# Patient Record
Sex: Male | Born: 1985 | Race: White | Hispanic: No | Marital: Single | Smoking: Current every day smoker
Health system: Southern US, Community
[De-identification: ages and names within clinical notes are randomized; demographics above are authoritative.]

## PROBLEM LIST (undated history)

## (undated) DIAGNOSIS — R011 Cardiac murmur, unspecified: Secondary | ICD-10-CM

## (undated) DIAGNOSIS — J45909 Unspecified asthma, uncomplicated: Secondary | ICD-10-CM

---

## 2013-08-17 ENCOUNTER — Encounter (HOSPITAL_COMMUNITY): Payer: Self-pay | Admitting: Emergency Medicine

## 2013-08-17 ENCOUNTER — Inpatient Hospital Stay (HOSPITAL_COMMUNITY)
Admission: EM | Admit: 2013-08-17 | Discharge: 2013-08-19 | DRG: 903 | Disposition: A | Discharge status: Home / Self Care | Payer: 59 | Attending: Specialist | Admitting: Specialist

## 2013-08-17 ENCOUNTER — Emergency Department (HOSPITAL_COMMUNITY): Payer: 59

## 2013-08-17 DIAGNOSIS — S81819A Laceration without foreign body, unspecified lower leg, initial encounter: Secondary | ICD-10-CM | POA: Diagnosis present

## 2013-08-17 DIAGNOSIS — S81809A Unspecified open wound, unspecified lower leg, initial encounter: Principal | ICD-10-CM

## 2013-08-17 DIAGNOSIS — S20419A Abrasion of unspecified back wall of thorax, initial encounter: Secondary | ICD-10-CM

## 2013-08-17 DIAGNOSIS — S20319A Abrasion of unspecified front wall of thorax, initial encounter: Secondary | ICD-10-CM

## 2013-08-17 DIAGNOSIS — S81812A Laceration without foreign body, left lower leg, initial encounter: Secondary | ICD-10-CM | POA: Diagnosis present

## 2013-08-17 DIAGNOSIS — IMO0002 Reserved for concepts with insufficient information to code with codable children: Secondary | ICD-10-CM | POA: Diagnosis present

## 2013-08-17 DIAGNOSIS — Y92009 Unspecified place in unspecified non-institutional (private) residence as the place of occurrence of the external cause: Secondary | ICD-10-CM

## 2013-08-17 DIAGNOSIS — F17200 Nicotine dependence, unspecified, uncomplicated: Secondary | ICD-10-CM | POA: Diagnosis present

## 2013-08-17 DIAGNOSIS — S81009A Unspecified open wound, unspecified knee, initial encounter: Principal | ICD-10-CM | POA: Diagnosis present

## 2013-08-17 DIAGNOSIS — S30811A Abrasion of abdominal wall, initial encounter: Secondary | ICD-10-CM

## 2013-08-17 DIAGNOSIS — J45909 Unspecified asthma, uncomplicated: Secondary | ICD-10-CM | POA: Diagnosis present

## 2013-08-17 DIAGNOSIS — S91009A Unspecified open wound, unspecified ankle, initial encounter: Principal | ICD-10-CM

## 2013-08-17 HISTORY — DX: Cardiac murmur, unspecified: R01.1

## 2013-08-17 HISTORY — DX: Unspecified asthma, uncomplicated: J45.909

## 2013-08-17 LAB — I-STAT CHEM 8, ED
BUN: 20 mg/dL (ref 6–23)
CALCIUM ION: 1.08 mmol/L — AB (ref 1.12–1.23)
Chloride: 104 mEq/L (ref 96–112)
Creatinine, Ser: 1.2 mg/dL (ref 0.50–1.35)
Glucose, Bld: 119 mg/dL — ABNORMAL HIGH (ref 70–99)
HCT: 50 % (ref 39.0–52.0)
HEMOGLOBIN: 17 g/dL (ref 13.0–17.0)
POTASSIUM: 5.1 meq/L (ref 3.7–5.3)
Sodium: 138 mEq/L (ref 137–147)
TCO2: 23 mmol/L (ref 0–100)

## 2013-08-17 MED ORDER — FENTANYL CITRATE 0.05 MG/ML IJ SOLN
50.0000 ug | Freq: Once | INTRAMUSCULAR | Status: AC
  Administered 2013-08-17: 50 ug via INTRAVENOUS
  Filled 2013-08-17: qty 2

## 2013-08-17 MED ORDER — ONDANSETRON HCL 4 MG/2ML IJ SOLN
4.0000 mg | Freq: Once | INTRAMUSCULAR | Status: AC
  Administered 2013-08-17: 4 mg via INTRAVENOUS
  Filled 2013-08-17: qty 2

## 2013-08-17 MED ORDER — SODIUM CHLORIDE 0.9 % IV SOLN
Freq: Once | INTRAVENOUS | Status: AC
  Administered 2013-08-17: 23:00:00 via INTRAVENOUS

## 2013-08-17 NOTE — ED Notes (Signed)
Xray at [facility] 
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
## 2013-08-18 ENCOUNTER — Emergency Department (HOSPITAL_COMMUNITY): Payer: 59 | Admitting: Anesthesiology

## 2013-08-18 ENCOUNTER — Encounter (HOSPITAL_COMMUNITY)
Admission: EM | Disposition: A | Discharge status: Home / Self Care | Payer: Self-pay | Source: Home / Self Care | Attending: Specialist

## 2013-08-18 ENCOUNTER — Encounter (HOSPITAL_COMMUNITY): Payer: 59 | Admitting: Anesthesiology

## 2013-08-18 DIAGNOSIS — S81812A Laceration without foreign body, left lower leg, initial encounter: Secondary | ICD-10-CM | POA: Diagnosis present

## 2013-08-18 DIAGNOSIS — S81819A Laceration without foreign body, unspecified lower leg, initial encounter: Secondary | ICD-10-CM | POA: Diagnosis present

## 2013-08-18 HISTORY — PX: LACERATION REPAIR: SHX5284

## 2013-08-18 LAB — CBC
HCT: 43.3 % (ref 39.0–52.0)
HEMOGLOBIN: 15.4 g/dL (ref 13.0–17.0)
MCH: 35.1 pg — ABNORMAL HIGH (ref 26.0–34.0)
MCHC: 35.6 g/dL (ref 30.0–36.0)
MCV: 98.6 fL (ref 78.0–100.0)
Platelets: 113 10*3/uL — ABNORMAL LOW (ref 150–400)
RBC: 4.39 MIL/uL (ref 4.22–5.81)
RDW: 11.7 % (ref 11.5–15.5)
WBC: 9.5 10*3/uL (ref 4.0–10.5)

## 2013-08-18 SURGERY — REPAIR, LACERATION, 2 OR MORE
Anesthesia: General | Site: Leg Lower | Laterality: Left

## 2013-08-18 MED ORDER — PROPOFOL 10 MG/ML IV BOLUS
INTRAVENOUS | Status: DC | PRN
  Administered 2013-08-18: 200 mg via INTRAVENOUS

## 2013-08-18 MED ORDER — CEFAZOLIN SODIUM 1-5 GM-% IV SOLN
1.0000 g | Freq: Once | INTRAVENOUS | Status: AC
  Administered 2013-08-18: 1 g via INTRAVENOUS
  Filled 2013-08-18: qty 50

## 2013-08-18 MED ORDER — METOCLOPRAMIDE HCL 5 MG/ML IJ SOLN: 5.0000 mg | Freq: Three times a day (TID) | INTRAMUSCULAR | Status: DC | PRN

## 2013-08-18 MED ORDER — SODIUM CHLORIDE 0.9 % IV BOLUS (SEPSIS)
1000.0000 mL | Freq: Once | INTRAVENOUS | Status: AC
  Administered 2013-08-18: 1000 mL via INTRAVENOUS

## 2013-08-18 MED ORDER — SUCCINYLCHOLINE CHLORIDE 20 MG/ML IJ SOLN
INTRAMUSCULAR | Status: DC | PRN
  Administered 2013-08-18: 120 mg via INTRAVENOUS

## 2013-08-18 MED ORDER — SODIUM CHLORIDE 0.9 % IR SOLN
Status: DC | PRN
  Administered 2013-08-18: 1000 mL

## 2013-08-18 MED ORDER — FENTANYL CITRATE 0.05 MG/ML IJ SOLN
INTRAMUSCULAR | Status: AC
  Administered 2013-08-18: 100 ug via INTRAVENOUS
  Filled 2013-08-18: qty 2

## 2013-08-18 MED ORDER — FENTANYL CITRATE 0.05 MG/ML IJ SOLN
INTRAMUSCULAR | Status: AC
  Filled 2013-08-18: qty 5

## 2013-08-18 MED ORDER — ONDANSETRON HCL 4 MG/2ML IJ SOLN
4.0000 mg | Freq: Four times a day (QID) | INTRAMUSCULAR | Status: DC | PRN
Start: 2013-08-18 — End: 2013-08-19

## 2013-08-18 MED ORDER — ONDANSETRON HCL 4 MG/2ML IJ SOLN: 4.0000 mg | Freq: Once | INTRAMUSCULAR | Status: DC | PRN

## 2013-08-18 MED ORDER — PROPOFOL 10 MG/ML IV BOLUS
INTRAVENOUS | Status: AC
  Filled 2013-08-18: qty 20

## 2013-08-18 MED ORDER — ONDANSETRON HCL 4 MG/2ML IJ SOLN
INTRAMUSCULAR | Status: AC
  Filled 2013-08-18: qty 2

## 2013-08-18 MED ORDER — CEFAZOLIN SODIUM-DEXTROSE 2-3 GM-% IV SOLR
2.0000 g | Freq: Four times a day (QID) | INTRAVENOUS | Status: AC
  Administered 2013-08-18 (×3): 2 g via INTRAVENOUS
  Filled 2013-08-18 (×3): qty 50

## 2013-08-18 MED ORDER — ONDANSETRON HCL 4 MG PO TABS: 4.0000 mg | ORAL_TABLET | Freq: Four times a day (QID) | ORAL | Status: DC | PRN

## 2013-08-18 MED ORDER — HYDROMORPHONE HCL PF 1 MG/ML IJ SOLN
0.2500 mg | INTRAMUSCULAR | Status: DC | PRN
  Administered 2013-08-18 (×4): 0.5 mg via INTRAVENOUS

## 2013-08-18 MED ORDER — METOCLOPRAMIDE HCL 10 MG PO TABS: 5.0000 mg | ORAL_TABLET | Freq: Three times a day (TID) | ORAL | Status: DC | PRN

## 2013-08-18 MED ORDER — ONDANSETRON HCL 4 MG/2ML IJ SOLN
INTRAMUSCULAR | Status: DC | PRN
  Administered 2013-08-18: 4 mg via INTRAVENOUS

## 2013-08-18 MED ORDER — HYDROMORPHONE HCL PF 1 MG/ML IJ SOLN
INTRAMUSCULAR | Status: AC
  Filled 2013-08-18: qty 1

## 2013-08-18 MED ORDER — ASPIRIN 325 MG PO TABS
325.0000 mg | ORAL_TABLET | Freq: Two times a day (BID) | ORAL | Status: DC
  Administered 2013-08-18 – 2013-08-19 (×3): 325 mg via ORAL
  Filled 2013-08-18 (×5): qty 1

## 2013-08-18 MED ORDER — LIDOCAINE HCL (CARDIAC) 20 MG/ML IV SOLN
INTRAVENOUS | Status: AC
  Filled 2013-08-18: qty 5

## 2013-08-18 MED ORDER — BUPIVACAINE HCL 0.25 % IJ SOLN
INTRAMUSCULAR | Status: DC | PRN
  Administered 2013-08-18: 6 mL

## 2013-08-18 MED ORDER — ALBUTEROL SULFATE (2.5 MG/3ML) 0.083% IN NEBU
2.5000 mg | INHALATION_SOLUTION | Freq: Four times a day (QID) | RESPIRATORY_TRACT | Status: DC | PRN
  Administered 2013-08-18: 2.5 mg via RESPIRATORY_TRACT
  Filled 2013-08-18 (×2): qty 3

## 2013-08-18 MED ORDER — GLYCOPYRROLATE 0.2 MG/ML IJ SOLN
INTRAMUSCULAR | Status: AC
  Filled 2013-08-18: qty 2

## 2013-08-18 MED ORDER — SUCCINYLCHOLINE CHLORIDE 20 MG/ML IJ SOLN
INTRAMUSCULAR | Status: AC
  Filled 2013-08-18: qty 1

## 2013-08-18 MED ORDER — ALBUTEROL SULFATE (2.5 MG/3ML) 0.083% IN NEBU
2.5000 mg | INHALATION_SOLUTION | Freq: Two times a day (BID) | RESPIRATORY_TRACT | Status: DC
  Administered 2013-08-18 – 2013-08-19 (×2): 2.5 mg via RESPIRATORY_TRACT
  Filled 2013-08-18 (×2): qty 3

## 2013-08-18 MED ORDER — ALBUTEROL SULFATE HFA 108 (90 BASE) MCG/ACT IN AERS: 2.0000 | INHALATION_SPRAY | Freq: Four times a day (QID) | RESPIRATORY_TRACT | Status: DC | PRN

## 2013-08-18 MED ORDER — OXYCODONE-ACETAMINOPHEN 5-325 MG PO TABS: 1.0000 | ORAL_TABLET | ORAL | Status: AC | PRN

## 2013-08-18 MED ORDER — FENTANYL CITRATE 0.05 MG/ML IJ SOLN
100.0000 ug | Freq: Once | INTRAMUSCULAR | Status: AC
  Administered 2013-08-18: 100 ug via INTRAVENOUS

## 2013-08-18 MED ORDER — HYDROCODONE-ACETAMINOPHEN 5-325 MG PO TABS
1.0000 | ORAL_TABLET | ORAL | Status: DC | PRN
  Administered 2013-08-18 – 2013-08-19 (×3): 2 via ORAL
  Filled 2013-08-18 (×3): qty 2

## 2013-08-18 MED ORDER — MUPIROCIN 2 % EX OINT
TOPICAL_OINTMENT | CUTANEOUS | Status: AC
  Filled 2013-08-18: qty 22

## 2013-08-18 MED ORDER — LACTATED RINGERS IV SOLN
INTRAVENOUS | Status: DC | PRN
  Administered 2013-08-18 (×2): via INTRAVENOUS

## 2013-08-18 MED ORDER — LIDOCAINE HCL (CARDIAC) 20 MG/ML IV SOLN
INTRAVENOUS | Status: DC | PRN
  Administered 2013-08-18: 100 mg via INTRAVENOUS

## 2013-08-18 MED ORDER — MIDAZOLAM HCL 5 MG/5ML IJ SOLN
INTRAMUSCULAR | Status: DC | PRN
  Administered 2013-08-18: 2 mg via INTRAVENOUS

## 2013-08-18 MED ORDER — OXYCODONE HCL 5 MG/5ML PO SOLN: 5.0000 mg | Freq: Once | ORAL | Status: DC | PRN

## 2013-08-18 MED ORDER — HYDROMORPHONE HCL PF 1 MG/ML IJ SOLN
0.5000 mg | INTRAMUSCULAR | Status: DC | PRN
  Administered 2013-08-18 – 2013-08-19 (×4): 1 mg via INTRAVENOUS
  Filled 2013-08-18 (×4): qty 1

## 2013-08-18 MED ORDER — OXYCODONE HCL 5 MG PO TABS: 5.0000 mg | ORAL_TABLET | Freq: Once | ORAL | Status: DC | PRN

## 2013-08-18 MED ORDER — NICOTINE 14 MG/24HR TD PT24
14.0000 mg | MEDICATED_PATCH | Freq: Every day | TRANSDERMAL | Status: DC
  Administered 2013-08-18 – 2013-08-19 (×2): 14 mg via TRANSDERMAL
  Filled 2013-08-18 (×2): qty 1

## 2013-08-18 MED ORDER — FENTANYL CITRATE 0.05 MG/ML IJ SOLN
INTRAMUSCULAR | Status: DC | PRN
Start: 2013-08-18 — End: 2013-08-18
  Administered 2013-08-18: 50 ug via INTRAVENOUS
  Administered 2013-08-18: 100 ug via INTRAVENOUS
  Administered 2013-08-18 (×2): 150 ug via INTRAVENOUS
  Administered 2013-08-18: 50 ug via INTRAVENOUS

## 2013-08-18 MED ORDER — OXYCODONE-ACETAMINOPHEN 5-325 MG PO TABS
1.0000 | ORAL_TABLET | ORAL | Status: DC | PRN
  Administered 2013-08-18 – 2013-08-19 (×5): 2 via ORAL
  Filled 2013-08-18 (×5): qty 2

## 2013-08-18 MED ORDER — MIDAZOLAM HCL 2 MG/2ML IJ SOLN
INTRAMUSCULAR | Status: AC
  Filled 2013-08-18: qty 2

## 2013-08-18 MED ORDER — BUPIVACAINE HCL (PF) 0.25 % IJ SOLN
INTRAMUSCULAR | Status: AC
  Filled 2013-08-18: qty 30

## 2013-08-18 MED ORDER — ROCURONIUM BROMIDE 50 MG/5ML IV SOLN
INTRAVENOUS | Status: AC
  Filled 2013-08-18: qty 1

## 2013-08-18 MED ORDER — CEPHALEXIN 500 MG PO CAPS: 500.0000 mg | ORAL_CAPSULE | Freq: Four times a day (QID) | ORAL | Status: AC

## 2013-08-18 MED ORDER — NEOSTIGMINE METHYLSULFATE 10 MG/10ML IV SOLN
INTRAVENOUS | Status: AC
  Filled 2013-08-18: qty 1

## 2013-08-18 SURGICAL SUPPLY — 44 items
BANDAGE ELASTIC 4 VELCRO ST LF (GAUZE/BANDAGES/DRESSINGS) ×3 IMPLANT
BANDAGE ELASTIC 6 VELCRO ST LF (GAUZE/BANDAGES/DRESSINGS) ×3 IMPLANT
BANDAGE GAUZE ELAST BULKY 4 IN (GAUZE/BANDAGES/DRESSINGS) ×3 IMPLANT
BNDG COHESIVE 4X5 TAN STRL (GAUZE/BANDAGES/DRESSINGS) ×3 IMPLANT
CUFF TOURNIQUET SINGLE 18IN (TOURNIQUET CUFF) ×3 IMPLANT
CUFF TOURNIQUET SINGLE 24IN (TOURNIQUET CUFF) IMPLANT
CUFF TOURNIQUET SINGLE 34IN LL (TOURNIQUET CUFF) IMPLANT
CUFF TOURNIQUET SINGLE 44IN (TOURNIQUET CUFF) IMPLANT
DRSG PAD ABDOMINAL 8X10 ST (GAUZE/BANDAGES/DRESSINGS) ×6 IMPLANT
ELECT REM PT RETURN 9FT ADLT (ELECTROSURGICAL)
ELECTRODE REM PT RTRN 9FT ADLT (ELECTROSURGICAL) IMPLANT
FACESHIELD WRAPAROUND (MASK) ×3 IMPLANT
GAUZE XEROFORM 5X9 LF (GAUZE/BANDAGES/DRESSINGS) ×3 IMPLANT
GLOVE BIO SURGEON STRL SZ 6.5 (GLOVE) ×2 IMPLANT
GLOVE BIO SURGEONS STRL SZ 6.5 (GLOVE) ×1
GLOVE BIOGEL PI IND STRL 6.5 (GLOVE) ×1 IMPLANT
GLOVE BIOGEL PI INDICATOR 6.5 (GLOVE) ×2
GLOVE SURG SS PI 8.0 STRL IVOR (GLOVE) ×6 IMPLANT
GOWN EXTRA PROTECTION XL (GOWNS) ×3 IMPLANT
GOWN STRL REUS W/ TWL LRG LVL3 (GOWN DISPOSABLE) ×3 IMPLANT
GOWN STRL REUS W/TWL LRG LVL3 (GOWN DISPOSABLE) ×6
HANDPIECE INTERPULSE COAX TIP (DISPOSABLE) ×2
KIT BASIN OR (CUSTOM PROCEDURE TRAY) ×3 IMPLANT
KIT ROOM TURNOVER OR (KITS) ×3 IMPLANT
MANIFOLD NEPTUNE II (INSTRUMENTS) ×3 IMPLANT
NS IRRIG 1000ML POUR BTL (IV SOLUTION) ×3 IMPLANT
PACK ORTHO EXTREMITY (CUSTOM PROCEDURE TRAY) ×3 IMPLANT
PAD ARMBOARD 7.5X6 YLW CONV (MISCELLANEOUS) ×6 IMPLANT
SET HNDPC FAN SPRY TIP SCT (DISPOSABLE) ×1 IMPLANT
SPONGE GAUZE 4X4 12PLY (GAUZE/BANDAGES/DRESSINGS) ×3 IMPLANT
SPONGE LAP 18X18 X RAY DECT (DISPOSABLE) ×3 IMPLANT
SPONGE LAP 4X18 X RAY DECT (DISPOSABLE) ×3 IMPLANT
STAPLER VISISTAT (STAPLE) ×3 IMPLANT
STAPLER VISISTAT 35W (STAPLE) ×3 IMPLANT
STOCKINETTE IMPERVIOUS 9X36 MD (GAUZE/BANDAGES/DRESSINGS) ×3 IMPLANT
SUT VIC AB 2-0 FS1 27 (SUTURE) ×6 IMPLANT
TOWEL OR 17X24 6PK STRL BLUE (TOWEL DISPOSABLE) ×3 IMPLANT
TOWEL OR 17X26 10 PK STRL BLUE (TOWEL DISPOSABLE) ×3 IMPLANT
TUBE ANAEROBIC SPECIMEN COL (MISCELLANEOUS) IMPLANT
TUBE CONNECTING 12'X1/4 (SUCTIONS) ×1
TUBE CONNECTING 12X1/4 (SUCTIONS) ×2 IMPLANT
UNDERPAD 30X30 INCONTINENT (UNDERPADS AND DIAPERS) ×3 IMPLANT
WATER STERILE IRR 1000ML POUR (IV SOLUTION) ×3 IMPLANT
YANKAUER SUCT BULB TIP NO VENT (SUCTIONS) ×3 IMPLANT
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
## 2013-08-18 NOTE — Progress Notes (Signed)
Paged Dr. Ermelinda DasBeane's answering service regarding patient's continued wheezing to see if it may be better to have Albuterol nebulizer as scheduled instead of PRN.

## 2013-08-18 NOTE — ED Notes (Addendum)
Report given to CRNA in SS 36, family present. No changes, pt alert, NAD, calm, interactive.

## 2013-08-18 NOTE — ED Notes (Signed)
No changes, VSS. [facility] Pt rolled self with assistance, continues to deny head neck or spinal back pain, "abrasions on back sting", LLE wound undressed sterile saline soaked gauze applied, no active bleeding, pain med and abx given, [facility], pt calm, NAD, alert.
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
## 2013-08-18 NOTE — Progress Notes (Signed)
Subjective: Day of Surgery Procedure(s) (LRB): IRRIGATION AND DEBRIDMENT LEFT LEG WOUND AND REPAIR LACERATION TO LEFT LEG  (Left) Patient reports pain as moderate.    Objective: Vital signs in last 24 hours: Temp:  [97.4 F (36.3 C)-98.8 F (37.1 C)] 98.8 F (37.1 C) (06/13 0453) Pulse Rate:  [78-112] 92 (06/13 0453) Resp:  [14-27] 20 (06/13 0453) BP: (125-157)/(79-100) 133/84 mmHg (06/13 0453) SpO2:  [91 %-100 %] 97 % (06/13 0453)  Intake/Output from previous day: 06/12 0701 - 06/13 0700 In: 4100 [I.V.:3050; IV Piggyback:1050] Out: 1335 [Urine:1325; Blood:10] Intake/Output this shift:     Recent Labs  08/17/13 2321 08/17/13 2327  HGB 15.4 17.0    Recent Labs  08/17/13 2321 08/17/13 2327  WBC 9.5  --   RBC 4.39  --   HCT 43.3 50.0  PLT 113*  --     Recent Labs  08/17/13 2327  NA 138  K 5.1  CL 104  BUN 20  CREATININE 1.20  GLUCOSE 119*   No results found for this basename: LABPT, INR,  in the last 72 hours  Neurologically intact Neurovascular intact Compartment soft Dressing with small amount bloody drainage  Assessment/Plan: Day of Surgery Procedure(s) (LRB): IRRIGATION AND DEBRIDMENT LEFT LEG WOUND AND REPAIR LACERATION TO LEFT LEG  (Left) Advance diet Up with therapy D/C IV fluids Plan for discharge tomorrow  Loanne DrillingLUISIO,Bemnet Trovato V 08/18/2013, 8:18 AM

## 2013-08-18 NOTE — Transfer of Care (Signed)
Immediate Anesthesia Transfer of Care Note  Patient: Edward [name]  Procedure(s) Performed: Procedure(s): IRRIGATION AND DEBRIDMENT LEFT LEG WOUND AND REPAIR LACERATION TO LEFT LEG  (Left)  Patient Location: PACU  Anesthesia Type:General  Level of Consciousness: awake, alert  and oriented  Airway & Oxygen Therapy: Patient Spontanous Breathing and Patient connected to nasal cannula oxygen  Post-op Assessment: Report given to PACU RN and Post -op Vital signs reviewed and stable  Post vital signs: Reviewed and stable  Complications: No apparent anesthesia complications

## 2013-08-18 NOTE — Anesthesia Preprocedure Evaluation (Addendum)
Anesthesia Evaluation  Patient identified by MRN, date of birth, ID band Patient awake    Reviewed: Allergy & Precautions, H&P , NPO status , Patient's Chart, lab work & pertinent test results  History of Anesthesia Complications Negative for: history of anesthetic complications  Airway Mallampati: II TM Distance: >3 FB Neck ROM: Full    Dental  (+) Teeth Intact, Dental Advisory Given   Pulmonary asthma , Current Smoker,  breath sounds clear to auscultation  + wheezing (Bilateral)      Cardiovascular negative cardio ROS  Rhythm:Regular Rate:Normal     Neuro/Psych negative neurological ROS     GI/Hepatic negative GI ROS, (+)     substance abuse  alcohol use and marijuana use,   Endo/Other  negative endocrine ROS  Renal/GU negative Renal ROS     Musculoskeletal negative musculoskeletal ROS (+)   Abdominal   Peds  Hematology negative hematology ROS (+)   Anesthesia Other Findings   Reproductive/Obstetrics                        Anesthesia Physical Anesthesia Plan  ASA: II and emergent  Anesthesia Plan: General   Post-op Pain Management:    Induction: Intravenous, Rapid sequence and Cricoid pressure planned  Airway Management Planned: Oral ETT  Additional Equipment:   Intra-op Plan:   Post-operative Plan: Extubation in OR  Informed Consent: I have reviewed the patients History and Physical, chart, labs and discussed the procedure including the risks, benefits and alternatives for the proposed anesthesia with the patient or authorized representative who has indicated his/her understanding and acceptance.   Dental advisory given  Plan Discussed with: CRNA, Anesthesiologist and Surgeon  Anesthesia Plan Comments:         Anesthesia Quick Evaluation

## 2013-08-18 NOTE — Anesthesia Procedure Notes (Signed)
Procedure Name: Intubation Date/Time: 08/18/2013 2:52 AM Performed by: Gyselle Matthew S Pre-anesthesia Checklist: Patient identified, Timeout performed, Emergency Drugs available, Suction available and Patient being monitored Patient Re-evaluated:Patient Re-evaluated prior to inductionOxygen Delivery Method: Circle system utilized Preoxygenation: Pre-oxygenation with 100% oxygen Intubation Type: IV induction, Rapid sequence and Cricoid Pressure applied Ventilation: Mask ventilation without difficulty Laryngoscope Size: Mac and 4 Grade View: Grade I Tube type: Oral Tube size: 7.5 mm Number of attempts: 1 Airway Equipment and Method: Stylet Placement Confirmation: ETT inserted through vocal cords under direct vision,  positive ETCO2 and breath sounds checked- equal and bilateral Secured at: 22 cm Tube secured with: Tape Dental Injury: Teeth and Oropharynx as per pre-operative assessment

## 2013-08-18 NOTE — Brief Op Note (Signed)
08/17/2013 - 08/18/2013  3:57 AM  PATIENT:  Edward [name]  28 y.o. male  PRE-OPERATIVE DIAGNOSIS:  laceration Left leg  POST-OPERATIVE DIAGNOSIS:  laceration Left leg  PROCEDURE:  Procedure(s): IRRIGATION AND DEBRIDMENT LEFT LEG WOUND AND REPAIR LACERATION TO LEFT LEG  (Left)  SURGEON:  Surgeon(s) and Role:    * Javier DockerJeffrey C Wyett Narine, MD - Primary  PHYSICIAN ASSISTANT:   ASSISTANTS: none   ANESTHESIA:   general  EBL:  Total I/O In: 4100 [I.V.:3050; IV Piggyback:1050] Out: 710 [Urine:700; Blood:10]  BLOOD ADMINISTERED:none  DRAINS: none   LOCAL MEDICATIONS USED:  MARCAINE     SPECIMEN:  No Specimen  DISPOSITION OF SPECIMEN:  N/A  COUNTS:  YES  TOURNIQUET:  * No tourniquets in log *  DICTATION: .Other Dictation: Dictation Number [id number]  PLAN OF CARE: Admit for overnight observation  PATIENT DISPOSITION:  PACU - hemodynamically stable.   Delay start of Pharmacological VTE agent (>24hrs) due to surgical blood loss or risk of bleeding: yes

## 2013-08-18 NOTE — Anesthesia Postprocedure Evaluation (Signed)
  Anesthesia Post-op Note  Patient: Edward [name]  Procedure(s) Performed: Procedure(s): IRRIGATION AND DEBRIDMENT LEFT LEG WOUND AND REPAIR LACERATION TO LEFT LEG  (Left)  Patient Location: PACU  Anesthesia Type:General  Level of Consciousness: awake, alert  and oriented  Airway and Oxygen Therapy: Patient Spontanous Breathing and Patient connected to nasal cannula oxygen  Post-op Pain: moderate  Post-op Assessment: Post-op Vital signs reviewed  Post-op Vital Signs: Reviewed  Last Vitals:  Filed Vitals:   08/18/13 0453  BP: 133/84  Pulse: 92  Temp: 37.1 C  Resp: 20    Complications: No apparent anesthesia complications

## 2013-08-18 NOTE — Care Management Utilization Note (Signed)
UR Completed.    Josafat Enrico Wise Manan Olmo, RN, BSN Phone #[phone] 

## 2013-08-19 NOTE — Evaluation (Signed)
Physical Therapy Evaluation and Discharge Patient Details Name: Edward ItoChristopher [name] MRN: [id number] DOB: 10-Sep-1985 Today's Date: 08/19/2013   History of Present Illness  28 y.o. male s/p I&D of left lower extremity after sustaining a leg wound.  Clinical Impression  Patient evaluated by Physical Therapy with no further acute PT needs identified. All education has been completed and the patient has no further questions. See below for any follow-up Physial Therapy or equipment needs. PT is signing off. Thank you for this referral.     Follow Up Recommendations No PT follow up    Equipment Recommendations  Rolling walker with 5" wheels;3in1 (PT)    Recommendations for Other Services       Precautions / Restrictions Precautions Precautions: None Restrictions Weight Bearing Restrictions: No      Mobility  Bed Mobility Overal bed mobility: Modified Independent                Transfers Overall transfer level: Needs assistance Equipment used: Rolling walker (2 wheeled)             General transfer comment: From lowest bed setting and table mat in gym - no cues needed. VCs and education for stepping up onto a step-stool backwards in order to enter his bed at home, similar to home environment. Supervision for safety, and performs this task without difficulty.  Ambulation/Gait Ambulation/Gait assistance: Supervision Ambulation Distance (Feet): 80 Feet (x2) Assistive device: Rolling walker (2 wheeled) Gait Pattern/deviations:  ("hop-to" pattern)     General Gait Details: VCs for safe DME use and to slow down intermittently. Overall shows good control of RW. He did not wish to learn to use crutches.  Stairs            Wheelchair Mobility    Modified Rankin (Stroke Patients Only)       Balance Overall balance assessment: Modified Independent (Uses RW for balance)                                           Pertinent Vitals/Pain Pt  reports pain as moderate Patient repositioned in chair for comfort.     Home Living Family/patient expects to be discharged to:: Private residence Living Arrangements: Spouse/significant other;Children;Other relatives Available Help at Discharge: Family;Available 24 hours/day Type of Home: House Home Access: Ramped entrance     Home Layout: One level Home Equipment: None      Prior Function Level of Independence: Independent               Hand Dominance   Dominant Hand: Right    Extremity/Trunk Assessment   Upper Extremity Assessment: Overall WFL for tasks assessed           Lower Extremity Assessment: LLE deficits/detail   LLE Deficits / Details: decreased strength and ROM - difficult to assess secondary to pain     Communication   Communication: No difficulties  Cognition Arousal/Alertness: Awake/alert Behavior During Therapy: WFL for tasks assessed/performed Overall Cognitive Status: Within Functional Limits for tasks assessed                      General Comments      Exercises        Assessment/Plan    PT Assessment Patent does not need any further PT services  PT Diagnosis     PT Problem List    PT Treatment  Interventions     PT Goals (Current goals can be found in the Care Plan section) Acute Rehab PT Goals Patient Stated Goal: go home PT Goal Formulation: No goals set, d/c therapy    Frequency     Barriers to discharge        Co-evaluation               End of Session   Activity Tolerance: Patient tolerated treatment well Patient left: in chair;with call bell/phone within reach;with family/visitor present Nurse Communication: Mobility status         Time: [id number] PT Time Calculation (min): 20 min   Charges:   PT Evaluation $Initial PT Evaluation Tier I: 1 Procedure PT Treatments $Gait Training: 8-22 mins   PT G Codes:         Edward MerlesLogan Secor Wren [name], [place] [phone]  Berton MountBarbour, Panhia Karl S 08/19/2013, 1:27  PM

## 2013-08-19 NOTE — Progress Notes (Signed)
Subjective: 1 Day Post-Op Procedure(s) (LRB): IRRIGATION AND DEBRIDMENT LEFT LEG WOUND AND REPAIR LACERATION TO LEFT LEG  (Left) Patient reports pain as moderate.  Has required IV Dilaudid but wants to go home today  Objective: Vital signs in last 24 hours: Temp:  [97.7 F (36.5 C)-98.1 F (36.7 C)] 98.1 F (36.7 C) (06/14 0711) Pulse Rate:  [74-79] 79 (06/14 0711) Resp:  [16-18] 18 (06/14 0711) BP: (128-134)/(74-86) 128/80 mmHg (06/14 0711) SpO2:  [97 %-99 %] 98 % (06/14 0742)  Intake/Output from previous day: 06/13 0701 - 06/14 0700 In: 120 [P.O.:120] Out: 1200 [Urine:1200] Intake/Output this shift: Total I/O In: 240 [P.O.:240] Out: -    Recent Labs  08/17/13 2321 08/17/13 2327  HGB 15.4 17.0    Recent Labs  08/17/13 2321 08/17/13 2327  WBC 9.5  --   RBC 4.39  --   HCT 43.3 50.0  PLT 113*  --     Recent Labs  08/17/13 2327  NA 138  K 5.1  CL 104  BUN 20  CREATININE 1.20  GLUCOSE 119*   No results found for this basename: LABPT, INR,  in the last 72 hours  Incision: dressing C/D/I No cellulitis present Compartment soft Dressing changed. Incision clean without erythema or exudate    Assessment/Plan: 1 Day Post-Op Procedure(s) (LRB): IRRIGATION AND DEBRIDMENT LEFT LEG WOUND AND REPAIR LACERATION TO LEFT LEG  (Left) Discharge home today after one session of PT  Edward [name] 08/19/2013, 8:43 AM

## 2013-08-19 NOTE — Discharge Instructions (Signed)
Change your bandage daily Keep your incision clean and dry

## 2013-08-19 NOTE — Care Management Note (Signed)
CARE MANAGEMENT NOTE 08/19/2013  Patient:  Edward [name],Edward [name]   Account Number:  [id number]  Date Initiated:  08/19/2013  Documentation initiated by:  Raiford NobleHALL,Paschal Blanton  Subjective/Objective Assessment:   s/p I&D of left leg laceration     Action/Plan:   home   Anticipated DC Date:  08/19/2013   Anticipated DC Plan:  HOME/SELF CARE  In-house referral  NA      DC Planning Services  NA      [facility]  DURABLE MEDICAL EQUIPMENT   Choice offered to / List presented to:     DME arranged  [facility]      [facility]  [facility]     [facility]  [facility]      [facility] agency  NA   Status of service:  Completed, signed off Medicare Important Message given?   (If response is "NO", the following Medicare IM given date fields will be blank) Date Medicare IM given:   Date Additional Medicare IM given:    Discharge Disposition:  HOME/SELF CARE  Per UR Regulation:    If discussed at Long Length of Stay Meetings, dates discussed:    Comments:  08/19/13 1120 ATIKAHALLRNCM [phone] Call received from patient's nurse indicating patient need walker and 3 in 1. Orders placed in computer. [facility]. Equipment to be delivered to room. Patient's nurse made aware. No further needs indicated. ATIKAHALLRNCM [phone]

## 2013-08-20 ENCOUNTER — Encounter (HOSPITAL_COMMUNITY): Payer: Self-pay | Admitting: Specialist
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
## 2013-08-29 NOTE — Progress Notes (Signed)
Patient ID: Edward ItoChristopher [name], male   DOB: [birth year], 28 y.o.   MRN: [id number] Procedure clarification: Excisional debridement #10 blade Debridement to bone. See op note.
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
